# Patient Record
Sex: Male | Born: 2000 | Race: Black or African American | Hispanic: No | Marital: Single | Smoking: Never smoker
Health system: Southern US, Community
[De-identification: ages and names within clinical notes are randomized; demographics above are authoritative.]

## PROBLEM LIST (undated history)

## (undated) ENCOUNTER — Ambulatory Visit: Source: Home / Self Care | Attending: Family Medicine | Admitting: Family Medicine

## (undated) DIAGNOSIS — F909 Attention-deficit hyperactivity disorder, unspecified type: Secondary | ICD-10-CM

---
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
## 2022-04-06 IMAGING — US US BREAST*R* LIMITED INC AXILLA
1 series · 5 of 5 positions shown · non-contrast
Comparison: None.

CLINICAL DATA: Patient presents for palpable abnormality within the
right breast.

EXAM:
ULTRASOUND OF THE BILATERAL BREAST

[Series 1: us breast*right* limited inc axilla · 0.06mm/px · 5 of 5 slices shown]
[im 1/5]
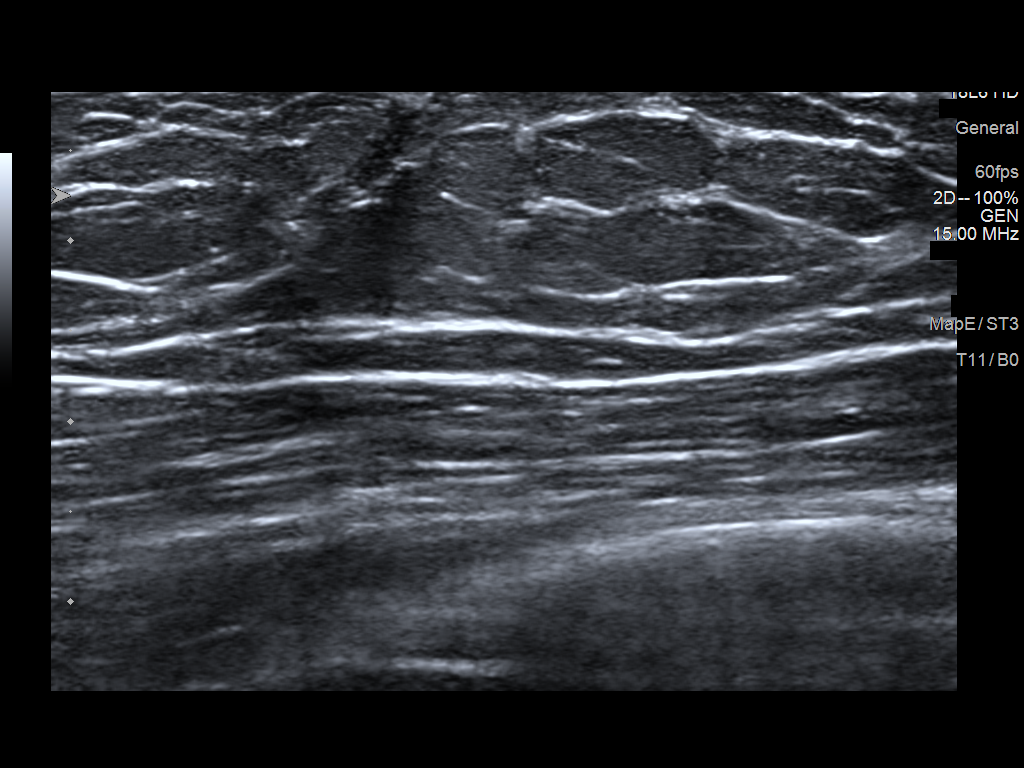
[im 2/5]
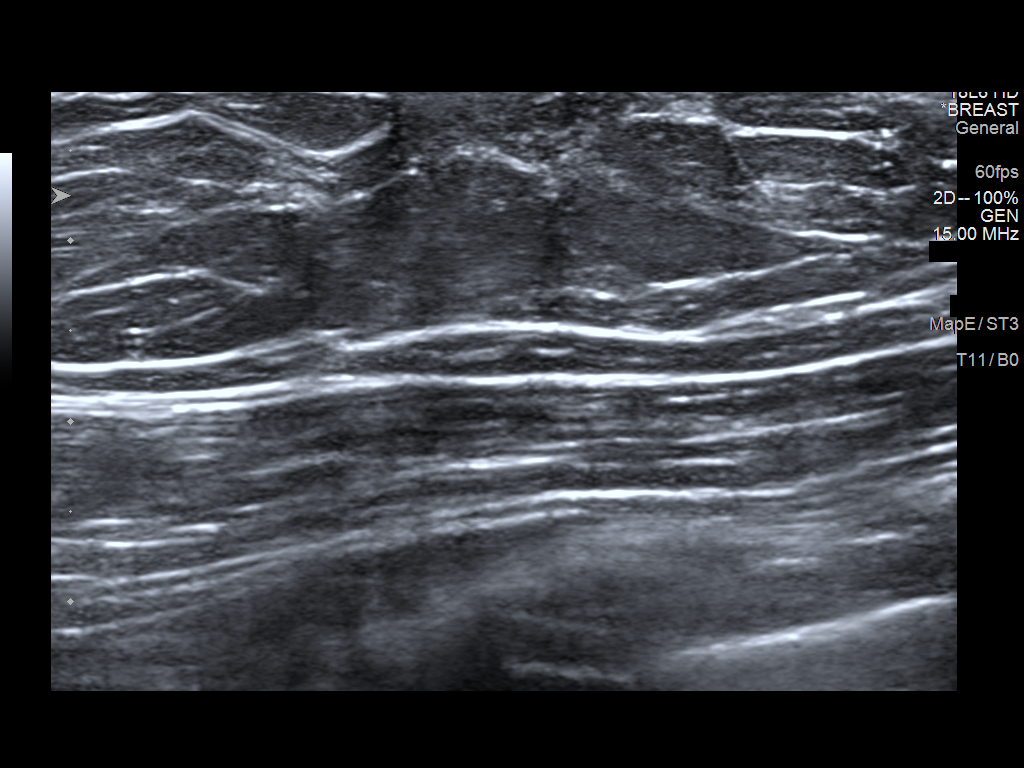
[im 3/5]
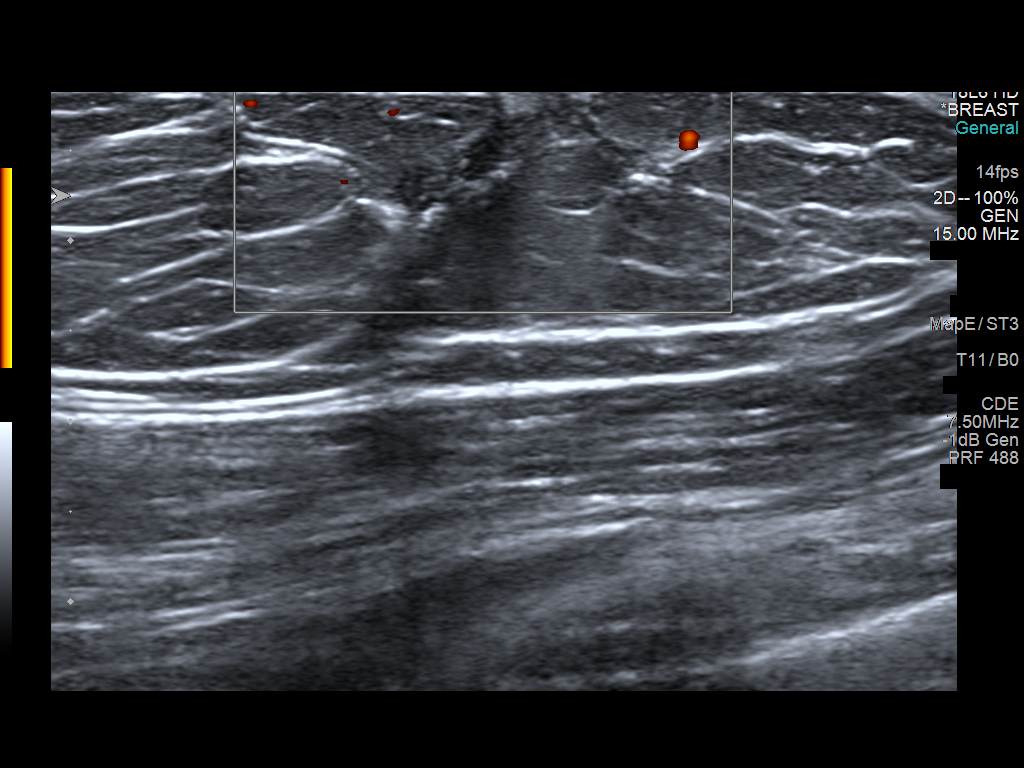
[im 4/5]
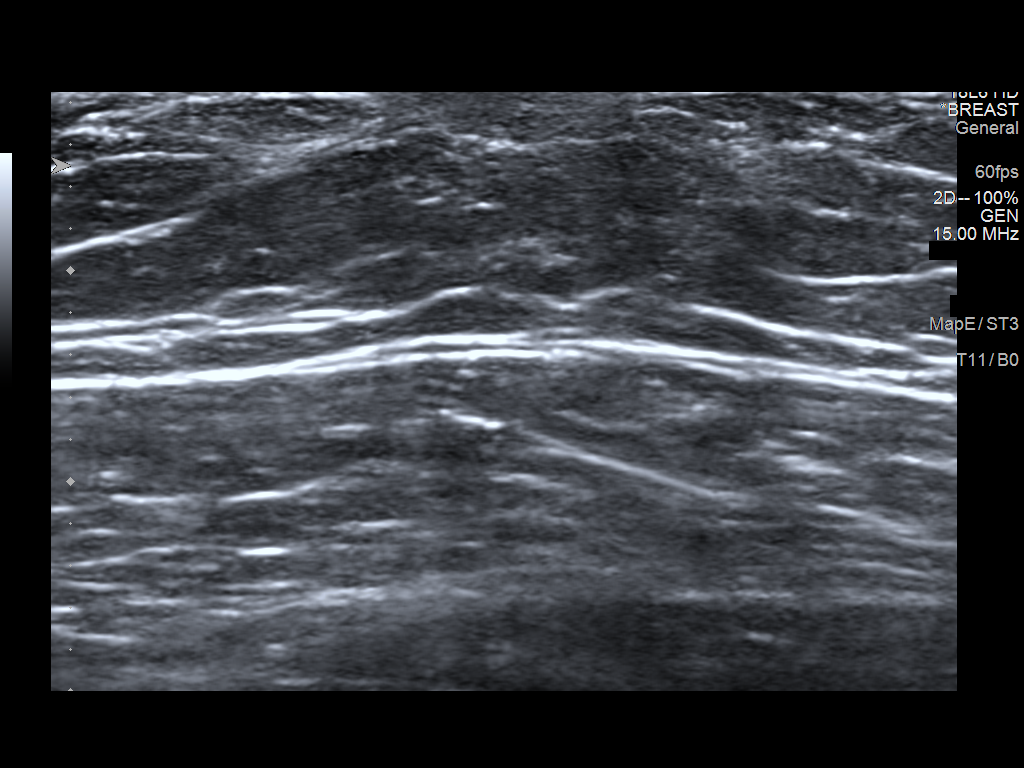
[im 5/5]
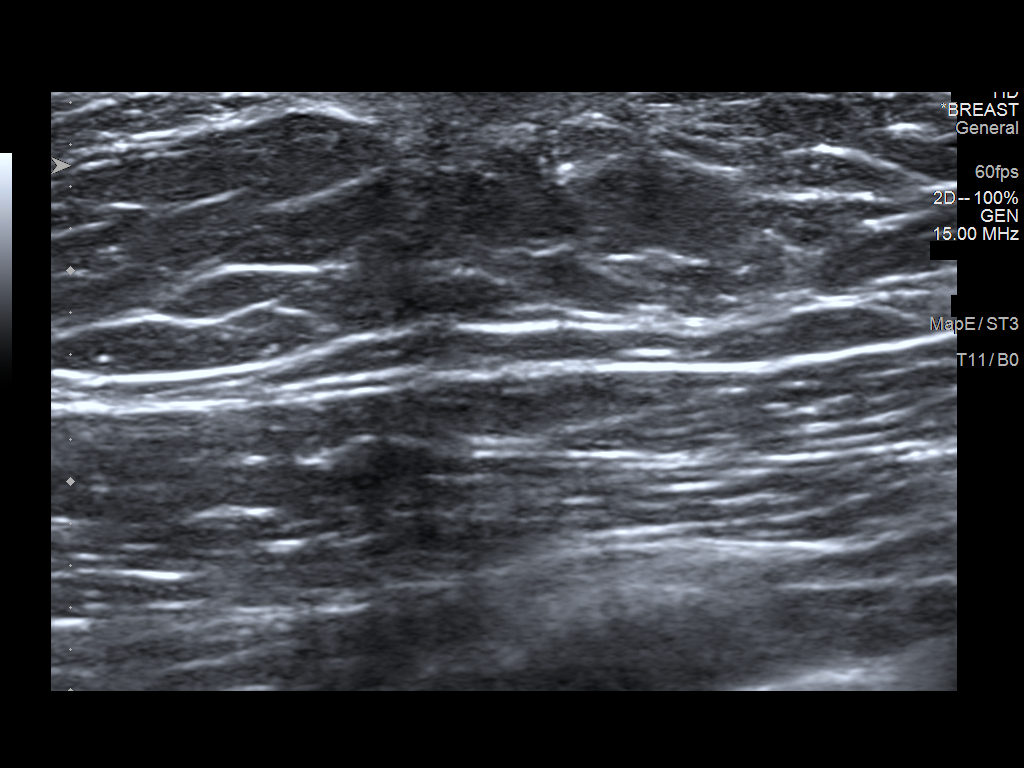

[5 of 5 positions shown; findings below may reference images not displayed]

FINDINGS: On physical exam, soft tissue is palpated within the retroareolar
right and retroareolar left breast.

Targeted ultrasound is performed, showing mild gynecomastia within
the right greater than left breast. No suspicious mass identified.
IMPRESSION: Bilateral mildly asymmetric retroareolar gynecomastia.

RECOMMENDATION:
Continued clinical evaluation for palpable areas of concern within
the right and left breast.

I have discussed the findings and recommendations with the patient.
If applicable, a reminder letter will be sent to the patient
regarding the next appointment.

BI-RADS CATEGORY  2: Benign.

## 2022-05-27 ENCOUNTER — Ambulatory Visit
Admission: EM | Admit: 2022-05-27 | Discharge: 2022-05-27 | Disposition: A | Discharge status: Home / Self Care | Payer: Medicaid Other | Attending: Physician Assistant | Admitting: Physician Assistant

## 2022-05-27 DIAGNOSIS — R112 Nausea with vomiting, unspecified: Secondary | ICD-10-CM

## 2022-05-27 DIAGNOSIS — K529 Noninfective gastroenteritis and colitis, unspecified: Secondary | ICD-10-CM | POA: Diagnosis not present

## 2022-05-27 MED ORDER — ONDANSETRON HCL 4 MG PO TABS: 4.0000 mg | ORAL_TABLET | Freq: Three times a day (TID) | ORAL | 0 refills | Status: DC | PRN

## 2022-05-27 MED ORDER — FAMOTIDINE 20 MG PO TABS
20.0000 mg | ORAL_TABLET | Freq: Once | ORAL | Status: AC
  Administered 2022-05-27: 20 mg via ORAL

## 2022-05-27 MED ORDER — ONDANSETRON 4 MG PO TBDP
4.0000 mg | ORAL_TABLET | Freq: Once | ORAL | Status: AC
  Administered 2022-05-27: 4 mg via ORAL

## 2022-05-27 MED ORDER — FAMOTIDINE 20 MG PO TABS: 20.0000 mg | ORAL_TABLET | Freq: Two times a day (BID) | ORAL | 0 refills | Status: DC

## 2022-05-27 NOTE — ED Triage Notes (Signed)
Pt presents to [facility] with co of ABD pain, nausea, vomiting for 3 days. Pain comes in waves and is stabbing in nature 9/10 . Pt reports he thought he was sick with food poisoning but 3 days he thought he would feel better by now. Pt reports pain is ruq and luq when it happens. Pt reports nausea/ pain is worse after eating. Has been able to keep down water.

## 2022-05-27 NOTE — Discharge Instructions (Signed)
Advised to take the Zofran 4 mg every 6 hours on a regular basis to help decrease nausea and vomiting. Advised to take Pepcid 20 mg twice daily in order to help control stomach irritation. Advised increase fluid intake and follow a bland diet for the next 48 hours. Advised to follow-up PCP or return to urgent care if symptoms fail to improve.

## 2022-05-27 NOTE — ED Provider Notes (Signed)
[name]    CSN: [id number] Arrival date & time: 05/27/22  1244      History   Chief Complaint Chief Complaint  Patient presents with   Abdominal Pain   Nausea   Vomiting   Diarrhea    HPI Erik [name] is a 21 y.o. male.   21 year old male presents with nausea and vomiting.  Patient indicates that on Monday he started having some abdominal cramping, nausea, with intermittent vomiting.  Patient also indicates that he has had some diarrhea, and loose bowels associated with his symptoms.  Patient indicates that the abdominal pain and vomiting started after he ate some old brownies at work.  Patient indicates for the past 4 days he has continued to have intermittent abdominal cramping pain with nausea and vomiting and intermittent diarrhea.  Patient indicates he is able to drink fluids well without any problems but as soon as he eats about 30 minutes later he has cramping and has to throw up.  He Isabella Stalling that the diarrhea has been watery and loose without any blood.  He has not been around any family, friends, or coworkers with similar symptoms.  He denies having any fever or chills.   Abdominal Pain Associated symptoms: diarrhea, nausea and vomiting   Diarrhea Associated symptoms: abdominal pain and vomiting     Past Medical History:  Diagnosis Date   ADHD (attention deficit hyperactivity disorder)     Patient Active Problem List   Diagnosis Date Noted   Acute appendicitis 05/22/2018    Past Surgical History:  Procedure Laterality Date   LAPAROSCOPIC APPENDECTOMY N/A 05/22/2018   Procedure: APPENDECTOMY LAPAROSCOPIC;  Surgeon: Leonia Corona, MD;  Location: [facility];  Service: Pediatrics;  Laterality: N/A;       Home Medications    Prior to Admission medications   Medication Sig Start Date End Date Taking? Authorizing Provider  famotidine (PEPCID) 20 MG tablet Take 1 tablet (20 mg total) by mouth 2 (two) times daily. 05/27/22  Yes Ellsworth Lennox, PA-C   ondansetron (ZOFRAN) 4 MG tablet Take 1 tablet (4 mg total) by mouth every 8 (eight) hours as needed for nausea or vomiting. 05/27/22  Yes Ellsworth Lennox, PA-C  ADDERALL XR 10 MG 24 hr capsule Take by mouth. Patient not taking: Reported on 05/27/2022 05/01/20   [provider]  HYDROcodone-acetaminophen (NORCO/VICODIN) 5-325 MG tablet Take 1-1.5 tablets by mouth every 6 (six) hours as needed for moderate pain. Take only if ibuprofen does not help the pain. Patient not taking: Reported on 06/02/2020 05/23/18   Leonia Corona, MD    Family History History reviewed. No pertinent family history.  Social History Social History   Tobacco Use   Smoking status: Never   Smokeless tobacco: Never     Allergies   Patient has no known allergies.   Review of Systems Review of Systems  Gastrointestinal:  Positive for abdominal pain, diarrhea, nausea and vomiting.     Physical Exam Triage Vital Signs ED Triage Vitals  Enc Vitals Group     BP 05/27/22 1307 119/74     Pulse Rate 05/27/22 1306 62     Resp 05/27/22 1306 20     Temp 05/27/22 1306 98 F (36.7 C)     Temp src --      SpO2 05/27/22 1306 97 %     Weight --      Height --      Head Circumference --      Peak Flow --  Pain Score 05/27/22 1305 0     Pain Loc --      Pain Edu? --      Excl. in GC? --    No data found.  Updated Vital Signs BP 119/74   Pulse 62   Temp 98 F (36.7 C)   Resp 20   SpO2 97%   Visual Acuity Right Eye Distance:   Left Eye Distance:   Bilateral Distance:    Right Eye Near:   Left Eye Near:    Bilateral Near:     Physical Exam Constitutional:      Appearance: He is well-developed.  HENT:     Mouth/Throat:     Mouth: Mucous membranes are moist.     Pharynx: Oropharynx is clear.  Cardiovascular:     Rate and Rhythm: Normal rate and regular rhythm.     Heart sounds: Normal heart sounds.  Pulmonary:     Effort: Pulmonary effort is normal.     Breath sounds: Normal  breath sounds and air entry. No wheezing, rhonchi or rales.  Abdominal:     General: Abdomen is flat. Bowel sounds are increased.     Palpations: Abdomen is soft.     Tenderness: There is no abdominal tenderness. There is no guarding or rebound.  Neurological:     Mental Status: He is alert.      UC Treatments / Results  Labs (all labs ordered are listed, but only abnormal results are displayed) Labs Reviewed - No data to display  EKG   Radiology No results found.  Procedures Procedures (including critical care time)  Medications Ordered in UC Medications  ondansetron (ZOFRAN-ODT) disintegrating tablet 4 mg (4 mg Oral Given 05/27/22 1319)  famotidine (PEPCID) tablet 20 mg (20 mg Oral Given 05/27/22 1319)    Initial Impression / Assessment and Plan / UC Course  I have reviewed the triage vital signs and the nursing notes.  Pertinent labs & imaging results that were available during my care of the patient were reviewed by me and considered in my medical decision making (see chart for details).    Plan: 1.  The nausea and vomiting will be treated with the following: A.  Zofran 4 mg given in the office today. B.  Zofran 4 mg every 6 hours on a regular basis to help control abdominal cramping and nausea. 2.  The gastroenteritis will be treated with the following: A.  Zofran 4 mg every 6 hours on a regular basis to help control nausea and vomiting. B.  Pepcid 20 mg every 12 hours to help control excess acid production in the stomach. 3.  Patient advised to increase fluid intake mainly clear liquids and bland diet for the next 48 hours. 4.  Patient advised follow-up with PCP or return to urgent care if symptoms fail to improve. Final Clinical Impressions(s) / UC Diagnoses   Final diagnoses:  Nausea and vomiting, unspecified vomiting type  Noninfectious gastroenteritis, unspecified type     Discharge Instructions      Advised to take the Zofran 4 mg every 6 hours on a  regular basis to help decrease nausea and vomiting. Advised to take Pepcid 20 mg twice daily in order to help control stomach irritation. Advised increase fluid intake and follow a bland diet for the next 48 hours. Advised to follow-up PCP or return to urgent care if symptoms fail to improve.    ED Prescriptions     Medication Sig Dispense Auth. Provider  ondansetron (ZOFRAN) 4 MG tablet Take 1 tablet (4 mg total) by mouth every 8 (eight) hours as needed for nausea or vomiting. 20 tablet Ellsworth Lennox, PA-C   famotidine (PEPCID) 20 MG tablet Take 1 tablet (20 mg total) by mouth 2 (two) times daily. 30 tablet Ellsworth Lennox, PA-C      PDMP not reviewed this encounter.   Ellsworth Lennox, PA-C 05/27/22 1324

## 2022-07-20 ENCOUNTER — Ambulatory Visit (INDEPENDENT_AMBULATORY_CARE_PROVIDER_SITE_OTHER): Payer: Medicaid Other | Admitting: Primary Care

## 2022-07-20 ENCOUNTER — Encounter (INDEPENDENT_AMBULATORY_CARE_PROVIDER_SITE_OTHER): Payer: Self-pay | Admitting: Primary Care

## 2022-07-20 VITALS — BP 128/83 | HR 68 | Resp 16 | Ht 67.0 in | Wt 175.2 lb

## 2022-07-20 DIAGNOSIS — R0989 Other specified symptoms and signs involving the circulatory and respiratory systems: Secondary | ICD-10-CM | POA: Diagnosis not present

## 2022-07-20 DIAGNOSIS — Z23 Encounter for immunization: Secondary | ICD-10-CM | POA: Diagnosis not present

## 2022-07-20 DIAGNOSIS — Z833 Family history of diabetes mellitus: Secondary | ICD-10-CM

## 2022-07-20 DIAGNOSIS — Z114 Encounter for screening for human immunodeficiency virus [HIV]: Secondary | ICD-10-CM | POA: Diagnosis not present

## 2022-07-20 DIAGNOSIS — F4323 Adjustment disorder with mixed anxiety and depressed mood: Secondary | ICD-10-CM

## 2022-07-20 DIAGNOSIS — Z1159 Encounter for screening for other viral diseases: Secondary | ICD-10-CM | POA: Diagnosis not present

## 2022-07-20 DIAGNOSIS — Z0000 Encounter for general adult medical examination without abnormal findings: Secondary | ICD-10-CM

## 2022-07-20 DIAGNOSIS — Z9049 Acquired absence of other specified parts of digestive tract: Secondary | ICD-10-CM

## 2022-07-20 DIAGNOSIS — E663 Overweight: Secondary | ICD-10-CM | POA: Diagnosis not present

## 2022-07-20 NOTE — Progress Notes (Signed)
New Patient Office Visit  Subjective    Patient ID: Erik [name], male    DOB: [birth year]  Age: 22 y.o. MRN: [id number]  CC:  Chief Complaint  Patient presents with   New Patient (Initial Visit)    HPI Erik [name] is a 22 year male not sexually active homosexual male presents to establish care.He is aware of transmission of STI and the use of condoms for protection.  He voices no complaints or concerns due to his age no longer pediatric patient. Saturday he had a fever - 100.3 productive cough with sore throat and shortness of breath. Self resolved. Patient has No headache, No chest pain, No abdominal pain - No Nausea, No new weakness tingling or numbness, No Cough - shortness of breath . He has depression and anxiety no medication . He denies harm to self or others, and not suicidal ideation.    Outpatient Encounter Medications as of 07/20/2022  Medication Sig   ADDERALL XR 10 MG 24 hr capsule Take by mouth. (Patient not taking: Reported on 05/27/2022)   famotidine (PEPCID) 20 MG tablet Take 1 tablet (20 mg total) by mouth 2 (two) times daily. (Patient not taking: Reported on 07/20/2022)   HYDROcodone-acetaminophen (NORCO/VICODIN) 5-325 MG tablet Take 1-1.5 tablets by mouth every 6 (six) hours as needed for moderate pain. Take only if ibuprofen does not help the pain. (Patient not taking: Reported on 06/02/2020)   ondansetron (ZOFRAN) 4 MG tablet Take 1 tablet (4 mg total) by mouth every 8 (eight) hours as needed for nausea or vomiting. (Patient not taking: Reported on 07/20/2022)   No facility-administered encounter medications on file as of 07/20/2022.    Past Medical History:  Diagnosis Date   ADHD (attention deficit hyperactivity disorder)     Past Surgical History:  Procedure Laterality Date   LAPAROSCOPIC APPENDECTOMY N/A 05/22/2018   Procedure: APPENDECTOMY LAPAROSCOPIC;  Surgeon: Gerald Stabs, MD;  Location: [place];  Service: Pediatrics;  Laterality: N/A;     No family history on file.  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 Lives with grandparents and sister. Works at [employer].   Social Determinants of Health   Financial Resource Strain: Low Risk  (05/22/2018)   Overall Financial Resource Strain (CARDIA)    Difficulty of Paying Living Expenses: Not hard at all  Food Insecurity: No Food Insecurity (05/22/2018)   Hunger Vital Sign    Worried About Running Out of Food in the Last Year: Never true    [place] in the Last Year: Never true  Transportation Needs: Unknown (05/22/2018)   PRAPARE - Transportation    Lack of Transportation (Medical): Patient refused    Lack of Transportation (Non-Medical): Patient refused  Physical Activity: Not on file  Stress: No Stress Concern Present (05/22/2018)   [employer] [facility] [place]    Feeling  of Stress : Not at all  Social Connections: Unknown (05/22/2018)   Social Connection and Isolation Panel [NHANES]    Frequency of Communication with Friends and Family: Not on file    Frequency of Social Gatherings with Friends and Family: Not on file    Attends Religious Services: Not on file    Active Member of Clubs or Organizations: Yes    Attends Archivist Meetings: Not on file    Marital Status: Not on file  Intimate Partner Violence: Unknown (05/22/2018)   Humiliation, Afraid, Rape, and Kick questionnaire    Fear of  Current or Ex-Partner: Patient refused    Emotionally Abused: Patient refused    Physically Abused: Patient refused    Sexually Abused: Patient refused    ROS Comprehensive ROS Pertinent positive and negative noted in HPI       Objective    Blood Pressure 128/83   Pulse 68   Respiration 16   Height 5\' 7"  (1.702 m)   Weight 175 lb 3.2 oz (79.5 kg)   Oxygen Saturation 96%   Body Mass Index 27.44 kg/m   Physical Exam General: No apparent distress. Eyes: Extraocular eye movements intact, pupils equal and round. Neck: Supple, trachea midline. Thyroid: No enlargement, mobile without fixation, no tenderness. Cardiovascular: Regular rhythm and rate, no murmur, normal radial pulses. Respiratory: Normal respiratory effort, clear to auscultation. Gastrointestinal: Normal pitch active bowel sounds, nontender abdomen without distention or appreciable hepatomegaly. Neurologic: Cranial nerves normal as tested, deep tendon reflexes  Musculoskeletal: Normal muscle tone, no tenderness on palpation of tibia, no excessive thoracic kyphosis. Skin: Appropriate warmth, no visible rash. Mental status: Alert, conversant, speech clear, thought logical, appropriate mood and affect, no hallucinations or delusions evident. Hematologic/lymphatic: No cervical adenopathy, no visible ecchymoses.     Assessment & Plan:  Erik [name] was seen today for new patient (initial visit).  Diagnoses and all orders for this visit:  Encounter for medical examination to establish care -     CBC with Differential -     CMP14+EGFR  Adjustment reaction with anxiety and depression Refer to CSW PHQ 19   Overweight (BMI 25.0-29.9) Discussed diet and exercise for person with BMI >25. Instructed: You must burn more calories than you eat. Losing 5 percent of your body weight should be considered a success. In the longer term, losing more than 15 percent of your body weight and staying at this weight is an extremely good  result. However, keep in mind that even losing 5 percent of your body weight leads to important health benefits, so try not to get discouraged if you're not able to lose more than this. Will recheck weight in 3-6 months.   Respiratory symptoms See HPI -     COVID-19, Flu A+B and RSV -     CBC with Differential -     CMP14+EGFR  Encounter for HCV screening test for low risk patient -     HCV Ab w Reflex to Quant PCR  Screening for HIV (human immunodeficiency virus) -     HIV Antibody (routine testing w rflx)  History of appendectomy  Family history of diabetes mellitus in mother -     Hemoglobin A1c  Need for diphtheria-tetanus-pertussis (Tdap) vaccine -     Tdap vaccine greater than or equal to 7yo IM- future     Return in about 1 year (around 07/21/2023).   Kerin Perna, NP

## 2022-07-20 NOTE — Patient Instructions (Signed)
Calorie Counting for Weight Loss Calories are units of energy. Your body needs a certain number of calories from food to keep going throughout the day. When you eat or drink more calories than your body needs, your body stores the extra calories mostly as fat. When you eat or drink fewer calories than your body needs, your body burns fat to get the energy it needs. Calorie counting means keeping track of how many calories you eat and drink each day. Calorie counting can be helpful if you need to lose weight. If you eat fewer calories than your body needs, you should lose weight. Ask your health care provider what a healthy weight is for you. For calorie counting to work, you will need to eat the right number of calories each day to lose a healthy amount of weight per week. A dietitian can help you figure out how many calories you need in a day and will suggest ways to reach your calorie goal. A healthy amount of weight to lose each week is usually 1-2 lb (0.5-0.9 kg). This usually means that your daily calorie intake should be reduced by 500-750 calories. Eating 1,200-1,500 calories a day can help most women lose weight. Eating 1,500-1,800 calories a day can help most men lose weight. What do I need to know about calorie counting? Work with your health care provider or dietitian to determine how many calories you should get each day. To meet your daily calorie goal, you will need to: Find out how many calories are in each food that you would like to eat. Try to do this before you eat. Decide how much of the food you plan to eat. Keep a food log. Do this by writing down what you ate and how many calories it had. To successfully lose weight, it is important to balance calorie counting with a healthy lifestyle that includes regular activity. Where do I find calorie information?  The number of calories in a food can be found on a Nutrition Facts label. If a food does not have a Nutrition Facts label, try  to look up the calories online or ask your dietitian for help. Remember that calories are listed per serving. If you choose to have more than one serving of a food, you will have to multiply the calories per serving by the number of servings you plan to eat. For example, the label on a package of bread might say that a serving size is 1 slice and that there are 90 calories in a serving. If you eat 1 slice, you will have eaten 90 calories. If you eat 2 slices, you will have eaten 180 calories. How do I keep a food log? After each time that you eat, record the following in your food log as soon as possible: What you ate. Be sure to include toppings, sauces, and other extras on the food. How much you ate. This can be measured in cups, ounces, or number of items. How many calories were in each food and drink. The total number of calories in the food you ate. Keep your food log near you, such as in a pocket-sized notebook or on an app or website on your mobile phone. Some programs will calculate calories for you and show you how many calories you have left to meet your daily goal. What are some portion-control tips? Know how many calories are in a serving. This will help you know how many servings you can have of a certain   food. Use a measuring cup to measure serving sizes. You could also try weighing out portions on a kitchen scale. With time, you will be able to estimate serving sizes for some foods. Take time to put servings of different foods on your favorite plates or in your favorite bowls and cups so you know what a serving looks like. Try not to eat straight from a food's packaging, such as from a bag or box. Eating straight from the package makes it hard to see how much you are eating and can lead to overeating. Put the amount you would like to eat in a cup or on a plate to make sure you are eating the right portion. Use smaller plates, glasses, and bowls for smaller portions and to prevent  overeating. Try not to multitask. For example, avoid watching TV or using your computer while eating. If it is time to eat, sit down at a table and enjoy your food. This will help you recognize when you are full. It will also help you be more mindful of what and how much you are eating. What are tips for following this plan? Reading food labels Check the calorie count compared with the serving size. The serving size may be smaller than what you are used to eating. Check the source of the calories. Try to choose foods that are high in protein, fiber, and vitamins, and low in saturated fat, trans fat, and sodium. Shopping Read nutrition labels while you shop. This will help you make healthy decisions about which foods to buy. Pay attention to nutrition labels for low-fat or fat-free foods. These foods sometimes have the same number of calories or more calories than the full-fat versions. They also often have added sugar, starch, or salt to make up for flavor that was removed with the fat. Make a grocery list of lower-calorie foods and stick to it. Cooking Try to cook your favorite foods in a healthier way. For example, try baking instead of frying. Use low-fat dairy products. Meal planning Use more fruits and vegetables. One-half of your plate should be fruits and vegetables. Include lean proteins, such as chicken, turkey, and fish. Lifestyle Each week, aim to do one of the following: 150 minutes of moderate exercise, such as walking. 75 minutes of vigorous exercise, such as running. General information Know how many calories are in the foods you eat most often. This will help you calculate calorie counts faster. Find a way of tracking calories that works for you. Get creative. Try different apps or programs if writing down calories does not work for you. What foods should I eat?  Eat nutritious foods. It is better to have a nutritious, high-calorie food, such as an avocado, than a food with  few nutrients, such as a bag of potato chips. Use your calories on foods and drinks that will fill you up and will not leave you hungry soon after eating. Examples of foods that fill you up are nuts and nut butters, vegetables, lean proteins, and high-fiber foods such as whole grains. High-fiber foods are foods with more than 5 g of fiber per serving. Pay attention to calories in drinks. Low-calorie drinks include water and unsweetened drinks. The items listed above may not be a complete list of foods and beverages you can eat. Contact a dietitian for more information. What foods should I limit? Limit foods or drinks that are not good sources of vitamins, minerals, or protein or that are high in unhealthy fats. These   include: Candy. Other sweets. Sodas, specialty coffee drinks, alcohol, and juice. The items listed above may not be a complete list of foods and beverages you should avoid. Contact a dietitian for more information. How do I count calories when eating out? Pay attention to portions. Often, portions are much larger when eating out. Try these tips to keep portions smaller: Consider sharing a meal instead of getting your own. If you get your own meal, eat only half of it. Before you start eating, ask for a container and put half of your meal into it. When available, consider ordering smaller portions from the menu instead of full portions. Pay attention to your food and drink choices. Knowing the way food is cooked and what is included with the meal can help you eat fewer calories. If calories are listed on the menu, choose the lower-calorie options. Choose dishes that include vegetables, fruits, whole grains, low-fat dairy products, and lean proteins. Choose items that are boiled, broiled, grilled, or steamed. Avoid items that are buttered, battered, fried, or served with cream sauce. Items labeled as crispy are usually fried, unless stated otherwise. Choose water, low-fat milk,  unsweetened iced tea, or other drinks without added sugar. If you want an alcoholic beverage, choose a lower-calorie option, such as a glass of wine or light beer. Ask for dressings, sauces, and syrups on the side. These are usually high in calories, so you should limit the amount you eat. If you want a salad, choose a garden salad and ask for grilled meats. Avoid extra toppings such as bacon, cheese, or fried items. Ask for the dressing on the side, or ask for olive oil and vinegar or lemon to use as dressing. Estimate how many servings of a food you are given. Knowing serving sizes will help you be aware of how much food you are eating at restaurants. Where to find more information Centers for Disease Control and Prevention: www.cdc.gov U.S. Department of Agriculture: myplate.gov Summary Calorie counting means keeping track of how many calories you eat and drink each day. If you eat fewer calories than your body needs, you should lose weight. A healthy amount of weight to lose per week is usually 1-2 lb (0.5-0.9 kg). This usually means reducing your daily calorie intake by 500-750 calories. The number of calories in a food can be found on a Nutrition Facts label. If a food does not have a Nutrition Facts label, try to look up the calories online or ask your dietitian for help. Use smaller plates, glasses, and bowls for smaller portions and to prevent overeating. Use your calories on foods and drinks that will fill you up and not leave you hungry shortly after a meal. This information is not intended to replace advice given to you by your health care provider. Make sure you discuss any questions you have with your health care provider. Document Revised: 08/08/2019 Document Reviewed: 08/08/2019 Elsevier Patient Education  2023 Elsevier Inc.  

## 2022-07-21 LAB — CMP14+EGFR
ALT: 18 IU/L (ref 0–44)
AST: 14 IU/L (ref 0–40)
Albumin/Globulin Ratio: 2 (ref 1.2–2.2)
Albumin: 4.9 g/dL (ref 4.3–5.2)
Alkaline Phosphatase: 94 IU/L (ref 51–125)
BUN/Creatinine Ratio: 13 (ref 9–20)
BUN: 14 mg/dL (ref 6–20)
Bilirubin Total: 0.5 mg/dL (ref 0.0–1.2)
CO2: 23 mmol/L (ref 20–29)
Calcium: 10.1 mg/dL (ref 8.7–10.2)
Chloride: 101 mmol/L (ref 96–106)
Creatinine, Ser: 1.04 mg/dL (ref 0.76–1.27)
Globulin, Total: 2.5 g/dL (ref 1.5–4.5)
Glucose: 67 mg/dL — ABNORMAL LOW (ref 70–99)
Potassium: 4.4 mmol/L (ref 3.5–5.2)
Sodium: 139 mmol/L (ref 134–144)
Total Protein: 7.4 g/dL (ref 6.0–8.5)
eGFR: 106 mL/min/{1.73_m2} (ref >59)

## 2022-07-21 LAB — CBC WITH DIFFERENTIAL/PLATELET
Basophils Absolute: 0 10*3/uL (ref 0.0–0.2)
Basos: 0 %
EOS (ABSOLUTE): 0.1 10*3/uL (ref 0.0–0.4)
Eos: 2 %
Hematocrit: 47.6 % (ref 37.5–51.0)
Hemoglobin: 16 g/dL (ref 13.0–17.7)
Immature Grans (Abs): 0 10*3/uL (ref 0.0–0.1)
Immature Granulocytes: 0 %
Lymphocytes Absolute: 2.4 10*3/uL (ref 0.7–3.1)
Lymphs: 30 %
MCH: 31 pg (ref 26.6–33.0)
MCHC: 33.6 g/dL (ref 31.5–35.7)
MCV: 92 fL (ref 79–97)
Monocytes Absolute: 0.6 10*3/uL (ref 0.1–0.9)
Monocytes: 8 %
Neutrophils Absolute: 4.8 10*3/uL (ref 1.4–7.0)
Neutrophils: 60 %
Platelets: 220 10*3/uL (ref 150–450)
RBC: 5.16 x10E6/uL (ref 4.14–5.80)
RDW: 12.6 % (ref 11.6–15.4)
WBC: 8.1 10*3/uL (ref 3.4–10.8)

## 2022-07-21 LAB — HCV INTERPRETATION

## 2022-07-21 LAB — HCV AB W REFLEX TO QUANT PCR: HCV Ab: NONREACTIVE

## 2022-07-21 LAB — HIV ANTIBODY (ROUTINE TESTING W REFLEX): HIV Screen 4th Generation wRfx: NONREACTIVE

## 2022-07-21 LAB — HEMOGLOBIN A1C
Est. average glucose Bld gHb Est-mCnc: 105 mg/dL
Hgb A1c MFr Bld: 5.3 % (ref 4.8–5.6)

## 2022-07-22 LAB — COVID-19, FLU A+B AND RSV
Influenza A, NAA: NOT DETECTED
Influenza B, NAA: NOT DETECTED
RSV, NAA: NOT DETECTED
SARS-CoV-2, NAA: NOT DETECTED

## 2022-07-26 ENCOUNTER — Telehealth (INDEPENDENT_AMBULATORY_CARE_PROVIDER_SITE_OTHER): Payer: Self-pay

## 2022-07-26 NOTE — Telephone Encounter (Signed)
Contacted pt to go over lab results pt wasn't home spoke with pt grandmother and she will have pt give [place] a call back to go over results

## 2022-07-27 ENCOUNTER — Telehealth: Payer: Self-pay

## 2022-07-27 NOTE — Telephone Encounter (Signed)
Patient desire in house visit with [facility]. He is scheduled 08/02/22 @10am  at [facility]

## 2022-07-27 NOTE — Telephone Encounter (Signed)
-----  Message from Shann Medal, [place] sent at 07/26/2022  4:51 PM EST ----- Regarding: follow up Please follow up and evaluate clients needs from notes and client. Provide resources as needed.  ----- Message ----- From: Kerin Perna, NP Sent: 07/20/2022   2:18 PM EST To: Shann Medal, LCSW  Patient would benefit from LCSW intervention

## 2022-07-29 ENCOUNTER — Telehealth (INDEPENDENT_AMBULATORY_CARE_PROVIDER_SITE_OTHER): Payer: Self-pay | Admitting: Primary Care

## 2022-07-29 ENCOUNTER — Encounter (INDEPENDENT_AMBULATORY_CARE_PROVIDER_SITE_OTHER): Payer: Self-pay

## 2022-07-29 NOTE — Telephone Encounter (Signed)
Pt. Given results, verbalizes understanding. 

## 2022-08-02 ENCOUNTER — Ambulatory Visit: Payer: Medicaid Other | Admitting: Licensed Clinical Social Worker

## 2022-08-04 ENCOUNTER — Ambulatory Visit (INDEPENDENT_AMBULATORY_CARE_PROVIDER_SITE_OTHER): Payer: Medicaid Other | Admitting: Licensed Clinical Social Worker

## 2022-08-04 DIAGNOSIS — F4323 Adjustment disorder with mixed anxiety and depressed mood: Secondary | ICD-10-CM

## 2022-08-23 ENCOUNTER — Ambulatory Visit (INDEPENDENT_AMBULATORY_CARE_PROVIDER_SITE_OTHER): Payer: Medicaid Other | Admitting: Primary Care

## 2022-09-27 NOTE — BH Specialist Note (Signed)
Integrated Behavioral Health Initial In-Person Visit  MRN: [id number] Name: Erik [name]  Number of [facility] Clinician visits: 1- Initial Visit  Session Start time: [id number]    Session End time: [id number]  Total time in minutes: 33   Types of Service: Individual psychotherapy  Interpretor:No. Interpretor Name and Language: n/a   Warm Hand Off Completed.    Subjective: Erik [name] is a 22 y.o. male accompanied by  himself Patient was referred by PCP for anxiety and depression. Patient reports the following symptoms/concerns: feeling nervous and anxious, worrying, easily ignored and feeling something bad will happen. Duration of problem: since his mother passed at a very young age; Severity of problem: mild  Objective: Mood: Anxious and Affect: Appropriate Risk of harm to self or others: No plan to harm self or others  Life Context: Family and Social: He and his sister are living with their grandparents School/Work: In college  Self-Care: cooking Life Changes: mother died when he was very young. Sister has had suicidal ideations  Patient and/or Family's Strengths/Protective Factors: Concrete supports in place (healthy food, safe environments, etc.) and Sense of purpose  Goals Addressed: Patient will: Reduce symptoms of: anxiety and depression Increase knowledge and/or ability of: coping skills  Demonstrate ability to: Increase healthy adjustment to current life circumstances  Progress towards Goals: Ongoing  Interventions: Interventions utilized: Mindfulness or Relaxation Training and Supportive Counseling  Standardized Assessments completed: GAD-7 and PHQ 9 Elevate mood and show evidence of usual energy, activities, and socialization level Reduce irritability and increase normal social interaction with family and friends   Patient and/or Family Response: Patient was very receptive to receiving services.  Patient discussed his family history with  the death of his mother at a young age.  Patient was able to process his feelings during his early childhood male.  And the importance of his relationship with his sister.  Patient discussed his feelings of anxiety and depression.  Therapist and patient were able to develop coping skills.  Patient Centered Plan: Patient is on the following Treatment Plan(s):  Anxiety /Depression  Assessment: Patient currently experiencing feeling nervous and anxious, worrying, easily ignored and feeling something bad will happen.   Patient may benefit from support from [facility] and outside therapist.  Plan: Follow up with behavioral health clinician on : 4 weeks Behavioral recommendations: on going therapy Referral(s): [facility]) and Counselor "From scale of 1-10, how likely are you to follow plan?": not sure  Shann Medal, LCSWA

## 2022-11-30 ENCOUNTER — Encounter: Payer: Self-pay | Admitting: Emergency Medicine

## 2022-11-30 ENCOUNTER — Ambulatory Visit
Admission: EM | Admit: 2022-11-30 | Discharge: 2022-11-30 | Disposition: A | Discharge status: Home / Self Care | Payer: Medicaid Other | Attending: Internal Medicine | Admitting: Internal Medicine

## 2022-11-30 DIAGNOSIS — Z1152 Encounter for screening for COVID-19: Secondary | ICD-10-CM | POA: Insufficient documentation

## 2022-11-30 DIAGNOSIS — B9789 Other viral agents as the cause of diseases classified elsewhere: Secondary | ICD-10-CM | POA: Insufficient documentation

## 2022-11-30 DIAGNOSIS — J069 Acute upper respiratory infection, unspecified: Secondary | ICD-10-CM | POA: Insufficient documentation

## 2022-11-30 DIAGNOSIS — J029 Acute pharyngitis, unspecified: Secondary | ICD-10-CM | POA: Diagnosis not present

## 2022-11-30 DIAGNOSIS — R059 Cough, unspecified: Secondary | ICD-10-CM | POA: Diagnosis present

## 2022-11-30 LAB — POCT RAPID STREP A (OFFICE): Rapid Strep A Screen: NEGATIVE

## 2022-11-30 MED ORDER — FLUTICASONE PROPIONATE 50 MCG/ACT NA SUSP: 1.0000 | Freq: Every day | NASAL | 0 refills | Status: DC

## 2022-11-30 MED ORDER — CHLORASEPTIC 1.4 % MT LIQD: 1.0000 | OROMUCOSAL | 0 refills | Status: DC | PRN

## 2022-11-30 MED ORDER — BENZONATATE 100 MG PO CAPS: 100.0000 mg | ORAL_CAPSULE | Freq: Three times a day (TID) | ORAL | 0 refills | Status: DC | PRN

## 2022-11-30 NOTE — Discharge Instructions (Signed)
Strep is negative.  Throat culture and COVID test pending.  Will call if it is abnormal.  I have sent you 3 medications to alleviate symptoms.  Follow-up if any symptoms persist or worsen.

## 2022-11-30 NOTE — ED Triage Notes (Signed)
Headache, sore throat, fever, starting Sunday. Tmax 102 at home. Reports nasal congestion, wheezing, abdominal pain, N/V/D. Taking dayquil and nyquil at home with mild relief.

## 2022-11-30 NOTE — ED Provider Notes (Signed)
[name]    CSN: [id number] Arrival date & time: 11/30/22  1643      History   Chief Complaint No chief complaint on file.   HPI Erik [name] is a 22 y.o. male.   Patient presents with headache, sore throat, fever, nasal congestion, cough that started about 4 days ago.  Patient denies chest pain, shortness of breath, nausea, vomiting, diarrhea.  Patient is taking DayQuil and NyQuil with minimal improvement of symptoms.  Tmax at home was 102 when symptoms first started.  Denies any known sick contacts.  Denies history of asthma.     Past Medical History:  Diagnosis Date   ADHD (attention deficit hyperactivity disorder)     Patient Active Problem List   Diagnosis Date Noted   Acute appendicitis 05/22/2018    Past Surgical History:  Procedure Laterality Date   LAPAROSCOPIC APPENDECTOMY N/A 05/22/2018   Procedure: APPENDECTOMY LAPAROSCOPIC;  Surgeon: Leonia Corona, MD;  Location: [facility] OR;  Service: Pediatrics;  Laterality: N/A;       Home Medications    Prior to Admission medications   Medication Sig Start Date End Date Taking? Authorizing Provider  benzonatate (TESSALON) 100 MG capsule Take 1 capsule (100 mg total) by mouth every 8 (eight) hours as needed for cough. 11/30/22  Yes Bettie Capistran, Rolly Salter E, FNP  fluticasone (FLONASE) 50 MCG/ACT nasal spray Place 1 spray into both nostrils daily. 11/30/22  Yes Ednah Hammock, Rolly Salter E, FNP  phenol (CHLORASEPTIC) 1.4 % LIQD Use as directed 1 spray in the mouth or throat as needed for throat irritation / pain. 11/30/22  Yes Lashun Ramseyer, [place] E, FNP  ADDERALL XR 10 MG 24 hr capsule Take by mouth. Patient not taking: Reported on 05/27/2022 05/01/20   [provider]  famotidine (PEPCID) 20 MG tablet Take 1 tablet (20 mg total) by mouth 2 (two) times daily. Patient not taking: Reported on 07/20/2022 05/27/22   Ellsworth Lennox, PA-C  HYDROcodone-acetaminophen (NORCO/VICODIN) 5-325 MG tablet Take 1-1.5 tablets by mouth every 6  (six) hours as needed for moderate pain. Take only if ibuprofen does not help the pain. Patient not taking: Reported on 06/02/2020 05/23/18   Leonia Corona, MD  ondansetron (ZOFRAN) 4 MG tablet Take 1 tablet (4 mg total) by mouth every 8 (eight) hours as needed for nausea or vomiting. Patient not taking: Reported on 07/20/2022 05/27/22   Ellsworth Lennox, PA-C    Family History History reviewed. No pertinent family history.  Social History Social History   Tobacco Use   Smoking status: Never   Smokeless tobacco: Never     Allergies   Patient has no known allergies.   Review of Systems Review of Systems Per HPI  Physical Exam Triage Vital Signs ED Triage Vitals  Enc Vitals Group     BP 11/30/22 1908 127/87     Pulse Rate 11/30/22 1908 77     Resp 11/30/22 1908 16     Temp 11/30/22 1908 98.7 F (37.1 C)     Temp Source 11/30/22 1908 Oral     SpO2 11/30/22 1908 98 %     Weight --      Height --      Head Circumference --      Peak Flow --      Pain Score 11/30/22 1909 6     Pain Loc --      Pain Edu? --      Excl. in GC? --    No data found.  Updated  Vital Signs BP 127/87 (BP Location: Left Arm)   Pulse 77   Temp 98.7 F (37.1 C) (Oral)   Resp 16   SpO2 98%   Visual Acuity Right Eye Distance:   Left Eye Distance:   Bilateral Distance:    Right Eye Near:   Left Eye Near:    Bilateral Near:     Physical Exam Constitutional:      General: He is not in acute distress.    Appearance: Normal appearance. He is not toxic-appearing or diaphoretic.  HENT:     Head: Normocephalic and atraumatic.     Right Ear: Tympanic membrane and ear canal normal.     Left Ear: Tympanic membrane and ear canal normal.     Nose: Congestion present.     Mouth/Throat:     Mouth: Mucous membranes are moist.     Pharynx: Posterior oropharyngeal erythema present.  Eyes:     Extraocular Movements: Extraocular movements intact.     Conjunctiva/sclera: Conjunctivae normal.      Pupils: Pupils are equal, round, and reactive to light.  Cardiovascular:     Rate and Rhythm: Normal rate and regular rhythm.     Pulses: Normal pulses.     Heart sounds: Normal heart sounds.  Pulmonary:     Effort: Pulmonary effort is normal. No respiratory distress.     Breath sounds: Normal breath sounds. No wheezing.  Abdominal:     General: Abdomen is flat. Bowel sounds are normal.     Palpations: Abdomen is soft.  Musculoskeletal:        General: Normal range of motion.     Cervical back: Normal range of motion.  Skin:    General: Skin is warm and dry.  Neurological:     General: No focal deficit present.     Mental Status: He is alert and oriented to person, place, and time. Mental status is at baseline.  Psychiatric:        Mood and Affect: Mood normal.        Behavior: Behavior normal.      UC Treatments / Results  Labs (all labs ordered are listed, but only abnormal results are displayed) Labs Reviewed  CULTURE, GROUP A STREP ([facility])  SARS CORONAVIRUS 2 (TAT 6-24 HRS)  POCT RAPID STREP A (OFFICE)    EKG   Radiology No results found.  Procedures Procedures (including critical care time)  Medications Ordered in UC Medications - No data to display  Initial Impression / Assessment and Plan / UC Course  I have reviewed the triage vital signs and the nursing notes.  Pertinent labs & imaging results that were available during my care of the patient were reviewed by me and considered in my medical decision making (see chart for details).     Patient presents with symptoms likely from a viral upper respiratory infection. Do not suspect underlying cardiopulmonary process. Symptoms seem unlikely related to ACS, CHF or COPD exacerbations, pneumonia, pneumothorax. Patient is nontoxic appearing and not in need of emergent medical intervention.  Rapid strep is negative.  Throat culture and COVID test pending.  Recommended symptom control with medications and  supportive care. patient was sent prescriptions.  Return if symptoms fail to improve in 1-2 weeks or you develop shortness of breath, chest pain, severe headache. Patient states understanding and is agreeable.  Discharged with PCP followup.  Final Clinical Impressions(s) / UC Diagnoses   Final diagnoses:  Viral upper respiratory tract infection with cough  Sore throat     Discharge Instructions      Strep is negative.  Throat culture and COVID test pending.  Will call if it is abnormal.  I have sent you 3 medications to alleviate symptoms.  Follow-up if any symptoms persist or worsen.     ED Prescriptions     Medication Sig Dispense Auth. Provider   fluticasone (FLONASE) 50 MCG/ACT nasal spray Place 1 spray into both nostrils daily. 16 g Tymeer Vaquera, Rolly Salter E, [place]   benzonatate (TESSALON) 100 MG capsule Take 1 capsule (100 mg total) by mouth every 8 (eight) hours as needed for cough. 21 capsule [facility] [place] E, [place]   phenol (CHLORASEPTIC) 1.4 % LIQD Use as directed 1 spray in the mouth or throat as needed for throat irritation / pain. 118 mL Gustavus Bryant, [place]      PDMP not reviewed this encounter.   Gustavus Bryant, [place] 11/30/22 1944

## 2022-12-01 LAB — CULTURE, GROUP A STREP (THRC)

## 2022-12-01 LAB — SARS CORONAVIRUS 2 (TAT 6-24 HRS): SARS Coronavirus 2: NEGATIVE

## 2022-12-02 ENCOUNTER — Encounter: Payer: Self-pay | Admitting: Emergency Medicine

## 2022-12-02 ENCOUNTER — Ambulatory Visit
Admission: EM | Admit: 2022-12-02 | Discharge: 2022-12-02 | Disposition: A | Discharge status: Home / Self Care | Payer: Medicaid Other | Attending: Family Medicine | Admitting: Family Medicine

## 2022-12-02 DIAGNOSIS — H66003 Acute suppurative otitis media without spontaneous rupture of ear drum, bilateral: Secondary | ICD-10-CM

## 2022-12-02 DIAGNOSIS — J0140 Acute pansinusitis, unspecified: Secondary | ICD-10-CM

## 2022-12-02 MED ORDER — AMOXICILLIN 875 MG PO TABS: 875.0000 mg | ORAL_TABLET | Freq: Two times a day (BID) | ORAL | 0 refills | Status: DC

## 2022-12-02 MED ORDER — PREDNISONE 10 MG PO TABS: 10.0000 mg | ORAL_TABLET | Freq: Every day | ORAL | 0 refills | Status: AC

## 2022-12-02 NOTE — ED Triage Notes (Signed)
Pt said today he began have severe pressure on his right ear. Pt said aches and is giving him a headache.

## 2022-12-03 LAB — CULTURE, GROUP A STREP (THRC)

## 2022-12-28 NOTE — ED Provider Notes (Signed)
[name]    CSN: [id number] Arrival date & time: 12/02/22  1618      History   Chief Complaint Chief Complaint  Patient presents with   Otalgia    HPI Erik [name] is a 22 y.o. male.   HPI Patient presents today for re-evaluation of worsening URI symptoms. Patient see two days ago with viral upper respiratory symptoms of headache, sore throat, fever, nasal congestion, cough  which at the time had been present for a total 4 days. Today he reports continued headache and new symptoms of severe pain in the right ear pain,  and bilateral ear pressure. COVID test and rapid strep both negative two days ago. He continues to endorse fever which improves with Tylenol. He has continued  to take OTC cough and cold medications without significant improvement of symptoms.   Past Medical History:  Diagnosis Date   ADHD (attention deficit hyperactivity disorder)     Patient Active Problem List   Diagnosis Date Noted   Acute appendicitis 05/22/2018    Past Surgical History:  Procedure Laterality Date   LAPAROSCOPIC APPENDECTOMY N/A 05/22/2018   Procedure: APPENDECTOMY LAPAROSCOPIC;  Surgeon: Leonia Corona, MD;  Location: [facility] OR;  Service: Pediatrics;  Laterality: N/A;       Home Medications    Prior to Admission medications   Medication Sig Start Date End Date Taking? Authorizing Provider  amoxicillin (AMOXIL) 875 MG tablet Take 1 tablet (875 mg total) by mouth 2 (two) times daily. 12/02/22  Yes Bing Neighbors, NP  ADDERALL XR 10 MG 24 hr capsule Take by mouth. Patient not taking: Reported on 05/27/2022 05/01/20   [provider]  benzonatate (TESSALON) 100 MG capsule Take 1 capsule (100 mg total) by mouth every 8 (eight) hours as needed for cough. 11/30/22   Gustavus Bryant, FNP  famotidine (PEPCID) 20 MG tablet Take 1 tablet (20 mg total) by mouth 2 (two) times daily. Patient not taking: Reported on 07/20/2022 05/27/22   Ellsworth Lennox, PA-C   fluticasone [facility]) 50 MCG/ACT nasal spray Place 1 spray into both nostrils daily. 11/30/22   Gustavus Bryant, FNP  HYDROcodone-acetaminophen (NORCO/VICODIN) 5-325 MG tablet Take 1-1.5 tablets by mouth every 6 (six) hours as needed for moderate pain. Take only if ibuprofen does not help the pain. Patient not taking: Reported on 06/02/2020 05/23/18   Leonia Corona, MD  ondansetron (ZOFRAN) 4 MG tablet Take 1 tablet (4 mg total) by mouth every 8 (eight) hours as needed for nausea or vomiting. Patient not taking: Reported on 07/20/2022 05/27/22   Ellsworth Lennox, PA-C  phenol [facility]) 1.4 % LIQD Use as directed 1 spray in the mouth or throat as needed for throat irritation / pain. 11/30/22   Gustavus Bryant, FNP    Family History History reviewed. No pertinent family history.  Social History Social History   Tobacco Use   Smoking status: Never   Smokeless tobacco: Never     Allergies   Patient has no known allergies.   Review of Systems Review of Systems Pertinent negatives listed in HPI  Physical Exam Triage Vital Signs ED Triage Vitals  Enc Vitals Group     BP 12/02/22 1713 132/80     Pulse Rate 12/02/22 1713 74     Resp 12/02/22 1713 16     Temp 12/02/22 1713 97.8 F (36.6 C)     Temp Source 12/02/22 1713 Axillary     SpO2 12/02/22 1713 97 %  Weight --      Height --      Head Circumference --      Peak Flow --      Pain Score 12/02/22 1712 10     Pain Loc --      Pain Edu? --      Excl. in GC? --    No data found.  Updated Vital Signs BP 132/80 (BP Location: Left Arm)   Pulse 74   Temp 97.8 F (36.6 C) (Axillary)   Resp 16   SpO2 97%   Visual Acuity Right Eye Distance:   Left Eye Distance:   Bilateral Distance:    Right Eye Near:   Left Eye Near:    Bilateral Near:     Physical Exam Vitals reviewed.  HENT:     Head: Normocephalic and atraumatic.     Right Ear: Decreased hearing noted. Swelling and tenderness present. Tympanic membrane is  injected, erythematous and bulging.     Left Ear: A middle ear effusion is present. Tympanic membrane is erythematous.     Nose: Nasal tenderness, mucosal edema, congestion and rhinorrhea present. Rhinorrhea is purulent.     Right Turbinates: Enlarged.     Left Turbinates: Enlarged.     Right Sinus: Maxillary sinus tenderness and frontal sinus tenderness present.     Left Sinus: Maxillary sinus tenderness and frontal sinus tenderness present.     Mouth/Throat:     Lips: Pink.     Mouth: Mucous membranes are moist.     Tongue: No lesions.     Pharynx: Oropharynx is clear. Uvula midline.  Eyes:     Extraocular Movements: Extraocular movements intact.     Pupils: Pupils are equal, round, and reactive to light.  Cardiovascular:     Rate and Rhythm: Normal rate.  Pulmonary:     Effort: Pulmonary effort is normal.     Breath sounds: Normal breath sounds.  Skin:    General: Skin is warm and dry.     Capillary Refill: Capillary refill takes less than 2 seconds.  Neurological:     Mental Status: He is alert.      UC Treatments / Results  Labs (all labs ordered are listed, but only abnormal results are displayed) Labs Reviewed - No data to display  EKG   Radiology No results found.  Procedures Procedures (including critical care time)  Medications Ordered in UC Medications - No data to display  Initial Impression / Assessment and Plan / UC Course  I have reviewed the triage vital signs and the nursing notes.  Pertinent labs & imaging results that were available during my care of the patient were reviewed by me and considered in my medical decision making (see chart for details).   Acute bilateral otitis media with acute sinuitis  Treatment per discharge medications orders. Continue Tylenol for fever.  Return precautions given. Final Clinical Impressions(s) / UC Diagnoses   Final diagnoses:  Non-recurrent acute suppurative otitis media of both ears without spontaneous  rupture of tympanic membranes  Acute non-recurrent pansinusitis   Discharge Instructions   None    ED Prescriptions     Medication Sig Dispense Auth. Provider   amoxicillin (AMOXIL) 875 MG tablet Take 1 tablet (875 mg total) by mouth 2 (two) times daily. 20 tablet Bing Neighbors, NP   predniSONE (DELTASONE) 10 MG tablet Take 1 tablet (10 mg total) by mouth daily with breakfast for 5 days. 5 tablet Bing Neighbors, NP  PDMP not reviewed this encounter.   Bing Neighbors, NP 12/28/22 0730

## 2023-01-25 ENCOUNTER — Encounter (INDEPENDENT_AMBULATORY_CARE_PROVIDER_SITE_OTHER): Payer: Self-pay | Admitting: Primary Care

## 2023-01-25 ENCOUNTER — Ambulatory Visit (INDEPENDENT_AMBULATORY_CARE_PROVIDER_SITE_OTHER): Payer: Medicaid Other | Admitting: Primary Care

## 2023-01-25 VITALS — BP 122/73 | HR 69 | Resp 16 | Ht 67.0 in | Wt 176.8 lb

## 2023-01-25 DIAGNOSIS — Z6827 Body mass index (BMI) 27.0-27.9, adult: Secondary | ICD-10-CM | POA: Diagnosis not present

## 2023-01-25 DIAGNOSIS — Z0000 Encounter for general adult medical examination without abnormal findings: Secondary | ICD-10-CM | POA: Diagnosis not present

## 2023-01-25 DIAGNOSIS — Z23 Encounter for immunization: Secondary | ICD-10-CM | POA: Diagnosis not present

## 2023-01-25 DIAGNOSIS — E663 Overweight: Secondary | ICD-10-CM | POA: Diagnosis not present

## 2023-01-25 DIAGNOSIS — H6121 Impacted cerumen, right ear: Secondary | ICD-10-CM | POA: Diagnosis not present

## 2023-01-25 NOTE — Progress Notes (Signed)
[facility]  Patient presents to clinic today for annual exam.   Acute Concerns: no  Chronic Issues: Past Medical History:  Diagnosis Date   ADHD (attention deficit hyperactivity disorder)       Health Maintenance: Immunizations -- UTD  HIV/Hep C Screening --   Past Medical History:  Diagnosis Date   ADHD (attention deficit hyperactivity disorder)     Past Surgical History:  Procedure Laterality Date   LAPAROSCOPIC APPENDECTOMY N/A 05/22/2018   Procedure: APPENDECTOMY LAPAROSCOPIC;  Surgeon: Leonia Corona, MD;  Location: [facility] OR;  Service: Pediatrics;  Laterality: N/A;    Current Outpatient Medications on File Prior to Visit  Medication Sig Dispense Refill   ADDERALL XR 10 MG 24 hr capsule Take by mouth. (Patient not taking: Reported on 05/27/2022)     amoxicillin (AMOXIL) 875 MG tablet Take 1 tablet (875 mg total) by mouth 2 (two) times daily. (Patient not taking: Reported on 01/25/2023) 20 tablet 0   benzonatate (TESSALON) 100 MG capsule Take 1 capsule (100 mg total) by mouth every 8 (eight) hours as needed for cough. (Patient not taking: Reported on 01/25/2023) 21 capsule 0   famotidine (PEPCID) 20 MG tablet Take 1 tablet (20 mg total) by mouth 2 (two) times daily. (Patient not taking: Reported on 07/20/2022) 30 tablet 0   fluticasone (FLONASE) 50 MCG/ACT nasal spray Place 1 spray into both nostrils daily. (Patient not taking: Reported on 01/25/2023) 16 g 0   HYDROcodone-acetaminophen (NORCO/VICODIN) 5-325 MG tablet Take 1-1.5 tablets by mouth every 6 (six) hours as needed for moderate pain. Take only if ibuprofen does not help the pain. (Patient not taking: Reported on 06/02/2020) 15 tablet 0   ondansetron (ZOFRAN) 4 MG tablet Take 1 tablet (4 mg total) by mouth every 8 (eight) hours as needed for nausea or vomiting. (Patient not taking: Reported on 07/20/2022) 20 tablet 0   phenol (CHLORASEPTIC) 1.4 % LIQD Use as directed 1 spray in the mouth or throat as  needed for throat irritation / pain. (Patient not taking: Reported on 01/25/2023) 118 mL 0   No current facility-administered medications on file prior to visit.    No Known Allergies  No family history on file.  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 Lives with grandparents and sister. Works at [employer].   Social Determinants of Health   Financial Resource Strain: Low Risk  (05/22/2018)   Overall Financial Resource Strain (CARDIA)    Difficulty of Paying Living Expenses: Not hard at all  Food Insecurity: No Food Insecurity (05/22/2018)   Hunger Vital Sign    Worried About Running Out of Food in the Last Year: Never true    Ran Out of Food in the Last Year: Never true  Transportation Needs: Unknown (05/22/2018)   PRAPARE - Administrator, Civil Service (Medical): Patient declined  Lack of Transportation (Non-Medical): Patient declined  Physical Activity: Not on file  Stress: No Stress Concern Present (05/22/2018)   [employer] of Occupational Health - Occupational Stress Questionnaire    Feeling of Stress : Not at all  Social Connections: Unknown (05/22/2018)   Social Connection and Isolation Panel [NHANES]    Frequency of Communication with Friends and Family: Not on file    Frequency of Social Gatherings with Friends and Family: Not on file    Attends Religious Services: Not on file    Active Member of Clubs or Organizations: Yes    Attends Tax inspector Meetings: Not on file    Marital Status: Not on file  Intimate Partner Violence: Unknown (05/22/2018)   Humiliation, Afraid, Rape, and Kick questionnaire    Fear of Current or Ex-Partner: Patient declined    Emotionally Abused: Patient declined    Physically Abused: Patient declined    Sexually Abused: Patient declined    ROS Comprehensive ROS Pertinent positive and negative noted in HPI    Blood Pressure 122/73   Pulse 69   Respiration 16   Height 5\' 7"  (1.702 m)   Weight 176 lb 12.8 oz (80.2 kg)   Oxygen Saturation 97%   Body Mass Index 27.69 kg/m   Physical Exam Vitals reviewed.  Constitutional:      Appearance: Normal appearance.  HENT:     Head: Normocephalic.     Right Ear: External ear normal. There is impacted cerumen.     Left Ear: Tympanic membrane and external ear normal.     Nose: Nose normal.  Eyes:     Extraocular Movements: Extraocular movements intact.     Pupils: Pupils are equal, round, and reactive to light.  Cardiovascular:     Rate and Rhythm: Normal rate and regular rhythm.  Pulmonary:     Effort: Pulmonary effort is normal.     Breath sounds: Normal breath sounds.  Abdominal:     General: Bowel sounds are normal.  Musculoskeletal:        General: Normal range of motion.     Cervical back: Normal range of motion.  Skin:    General: Skin is warm and dry.  Neurological:     Mental Status: He is alert and oriented to person, place, and time.  Psychiatric:        Mood and Affect: Mood normal.   Recent Results (from the past 2160 hour(s))  POCT rapid strep A     Status: None   Collection Time: 11/30/22  7:19 PM  Result Value Ref Range   Rapid Strep A Screen Negative Negative  Culture, group A strep     Status: None   Collection Time: 11/30/22  7:23 PM   Specimen: Throat  Result Value Ref Range   Specimen Description THROAT    Special Requests NONE    Culture      NO GROUP A STREP (S.PYOGENES) ISOLATED Performed at [facility], [address], [place], [place] [address]    Report Status 12/03/2022 FINAL   SARS CORONAVIRUS 2 (TAT 6-24 HRS) Anterior Nasal Swab     Status: None   Collection Time: 11/30/22  7:32 PM   Specimen: Anterior Nasal Swab  Result Value Ref Range   SARS Coronavirus 2 NEGATIVE NEGATIVE    Comment: (NOTE) SARS-CoV-2 target nucleic acids are NOT DETECTED.  The SARS-CoV-2 RNA is generally detectable in upper and lower respiratory specimens during the acute phase of  infection. Negative results do not preclude SARS-CoV-2 infection, do not rule out co-infections with other pathogens, and should not be used as the sole basis for treatment or other patient management decisions. Negative results must be combined with clinical observations, patient history, and epidemiological information. The expected result is Negative.  Fact Sheet for Patients: HairSlick.no  Fact Sheet for Healthcare Providers: quierodirigir.com  This test is not yet approved or cleared by the [place] FDA and  has been authorized for detection and/or diagnosis of SARS-CoV-2 by FDA under an Emergency Use Authorization (EUA). This EUA will remain  in effect (meaning this test can be used) for the duration of the COVID-19 declaration under Se ction 564(b)(1) of the Act, 21 U.S.C. section 360bbb-3(b)(1), unless the authorization is terminated or revoked sooner.  Performed at [facility], [address] [place], [place] [address]     Assessment/Plan: Brett was seen today for annual exam.  Diagnoses and all orders for this visit:  Annual physical exam  Overweight (BMI 25.0-29.9) Obesity is 30-39 indicating an excess in caloric intake or underlining conditions. This may lead to other co-morbidities. Educated on lifestyle modifications of diet and exercise which may reduce obesity.    Hearing loss of right ear due to cerumen  impaction Return for ear irrigation   This note has been created with Education officer, environmental. Any transcriptional errors are unintentional.   Grayce Sessions, NP 01/25/2023, 3:14 PM

## 2023-01-25 NOTE — Patient Instructions (Signed)
HPV Vaccine Information for Parents  Human papillomavirus (HPV) is a common virus. It spreads easily from person to person through skin-to-skin or sexual contact. There are many types of HPV viruses. Genital or mucosal HPV can cause warts in the genitals. Cutaneous or nonmucosal HPV can cause warts on the hands or feet. Some genital HPV types may cause cancer. Your child can get a shot to help prevent the HPV types that can cause cancer, genital warts, or warts near the opening of the butt (anus). The vaccine is safe and effective. It is recommended that your child get the vaccine at about 93-25 years of age. Getting the vaccine before your child is sexually active gives them the best protection from HPV through adulthood. How can HPV affect my child? An infection with HPV can cause: Genital warts. Mouth or throat cancer. Cancer of the anus. Cancer of the lowest part of the uterus (cervix), outer male genital area (vulva), or vagina. Cancer of the penis (penile cancer). During pregnancy, HPV can be passed to the baby. This infection can cause warts to form in the baby's throat and mouth. What actions can I take to lower my child's risk for HPV? Have your child get the HPV vaccine before they become sexually active. The best time to get the shot is at around 66-46 years of age. The vaccine may be given to children as young as 33 years old.  If your child gets the vaccine before they are 24 years old, it can be given as 2 shots, 6-12 months apart. In some cases, 3 doses are needed. Your child may need 3 doses if: They get the first dose before they are 20 years old but do not have a second dose within 6-12 months after the first dose. They get their first dose after they are 20 years old. They will need to get the other 2 doses within 6 months of the first dose. They have a weak body defense system (immune system). What are the risks and benefits of the HPV vaccine? Benefits Getting the vaccine  can help prevent certain cancers. These include: Oral cancer. This is cancer of the mouth. Anal cancer. This is cancer of the anus. Cancers of the cervix, vulva, and vagina in females. Penile cancer in males. Your child is less likely to get these cancers if they get the vaccine before they become sexually active. The vaccine also prevents genital warts caused by HPV. Risks In rare cases, side effects and reactions have been reported. These include: Soreness, redness, or swelling at the injection site. Dizziness or fainting. Fever. Headache. Muscle or joint pain. Who should not get the HPV vaccine or wait to get it? Some children should not get the HPV vaccine or should wait to get it. Ask the health care provider if your child should get the vaccine if: Your child has had a severe allergic reaction to other vaccines. Your child is allergic to yeast. Your child has a fever. Your child has had a recent illness. Your child is pregnant or may be pregnant. Where to find more information Centers for Disease Control and Prevention (CDC): TonerPromos.no American Academy of Pediatrics (AAP): healthychildren.org This information is not intended to replace advice given to you by your health care provider. Make sure you discuss any questions you have with your health care provider. Document Revised: 03/25/2022 Document Reviewed: 03/25/2022 Elsevier Patient Education  2024 [employer].

## 2023-02-27 ENCOUNTER — Ambulatory Visit (INDEPENDENT_AMBULATORY_CARE_PROVIDER_SITE_OTHER): Payer: Medicaid Other

## 2023-04-13 ENCOUNTER — Ambulatory Visit
Admission: EM | Admit: 2023-04-13 | Discharge: 2023-04-13 | Disposition: A | Discharge status: Home / Self Care | Payer: Medicaid Other | Attending: Physician Assistant | Admitting: Physician Assistant

## 2023-04-13 DIAGNOSIS — H00014 Hordeolum externum left upper eyelid: Secondary | ICD-10-CM

## 2023-04-13 DIAGNOSIS — E669 Obesity, unspecified: Secondary | ICD-10-CM | POA: Insufficient documentation

## 2023-04-13 MED ORDER — ERYTHROMYCIN 5 MG/GM OP OINT: TOPICAL_OINTMENT | OPHTHALMIC | 0 refills | Status: AC

## 2023-04-13 NOTE — ED Triage Notes (Signed)
"  I woke up this morning and my left eye, upper lid was swollen". "A little crusty this morning, ? Discharge". No injury. No eye redness or pain "just eye lid".

## 2023-04-13 NOTE — ED Provider Notes (Signed)
Erik [name]    CSN: [id number] Arrival date & time: 04/13/23  0854      History   Chief Complaint Chief Complaint  Patient presents with   Eye Problem    HPI Erik [name] is a 22 y.o. male.   Patient here today for patient of mild swelling to his left upper eyelid.  He states he woke with this this morning.  He noted some mild crusting to his eye this morning but no other discharge.  He has not any injury.  He reports mild pain to the eyelid itself.  He does not report vision changes.  He does not report treatment for symptoms.  The history is provided by the patient.    Past Medical History:  Diagnosis Date   ADHD (attention deficit hyperactivity disorder)     Patient Active Problem List   Diagnosis Date Noted   Obesity 04/13/2023   Vaccine counseling 04/30/2020   Need for vaccination 04/30/2020   Acute appendicitis 05/22/2018    Past Surgical History:  Procedure Laterality Date   LAPAROSCOPIC APPENDECTOMY N/A 05/22/2018   Procedure: APPENDECTOMY LAPAROSCOPIC;  Surgeon: Leonia Corona, MD;  Location: [facility] OR;  Service: Pediatrics;  Laterality: N/A;       Home Medications    Prior to Admission medications   Medication Sig Start Date End Date Taking? Authorizing Provider  erythromycin ophthalmic ointment Place a 1/2 inch ribbon of ointment into the lower eyelid. 04/13/23  Yes [name], PA-C    Family History History reviewed. No pertinent family history.  Social History Social History   Tobacco Use   Smoking status: Never    Passive exposure: Never   Smokeless tobacco: Never  Vaping Use   Vaping status: Never Used  Substance Use Topics   Alcohol use: Never   Drug use: Never     Allergies   Patient has no known allergies.   Review of Systems Review of Systems  Constitutional:  Negative for chills and fever.  Eyes:  Positive for pain. Negative for photophobia, discharge, redness and visual disturbance.  Neurological:   Negative for numbness.     Physical Exam Triage Vital Signs ED Triage Vitals  Encounter Vitals Group     BP      Systolic BP Percentile      Diastolic BP Percentile      Pulse      Resp      Temp      Temp src      SpO2      Weight      Height      Head Circumference      Peak Flow      Pain Score      Pain Loc      Pain Education      Exclude from Growth Chart    No data found.  Updated Vital Signs BP 113/68 (BP Location: Right Arm)   Pulse 72   Temp 97.6 F (36.4 C) (Oral)   Resp 16   Ht 5\' 7"  (1.702 m)   Wt 175 lb (79.4 kg)   SpO2 96%   BMI 27.41 kg/m   Visual Acuity Right Eye Distance: 20/20 (Uncorrected) Left Eye Distance: 20/25 (Uncorrected) Bilateral Distance:       Physical Exam Vitals and nursing note reviewed.  Constitutional:      General: He is not in acute distress.    Appearance: Normal appearance. He is not ill-appearing.  HENT:  Head: Normocephalic and atraumatic.  Eyes:     Extraocular Movements: Extraocular movements intact.     Conjunctiva/sclera: Conjunctivae normal.     Pupils: Pupils are equal, round, and reactive to light.     Comments: Mild swelling to the lateral upper eyelid  Cardiovascular:     Rate and Rhythm: Normal rate.  Pulmonary:     Effort: Pulmonary effort is normal.  Neurological:     Mental Status: He is alert.  Psychiatric:        Mood and Affect: Mood normal.        Behavior: Behavior normal.        Thought Content: Thought content normal.      UC Treatments / Results  Labs (all labs ordered are listed, but only abnormal results are displayed) Labs Reviewed - No data to display  EKG   Radiology No results found.  Procedures Procedures (including critical [name] time)  Medications Ordered in UC Medications - No data to display  Initial Impression / Assessment and Plan / UC Course  I have reviewed the triage vital signs and the nursing notes.  Pertinent labs & imaging results that were  available during my [name] of the patient were reviewed by me and considered in my medical decision making (see chart for details).    Suspect stye and recommended alternating warm and cold compresses.  Discussed that it may take several days for symptoms to resolve completely.  Erythromycin ointment prescribed.  Encouraged follow-up if no gradual improvement with any further concerns.  Final Clinical Impressions(s) / UC Diagnoses   Final diagnoses:  Hordeolum externum of left upper eyelid   Discharge Instructions   None    ED Prescriptions     Medication Sig Dispense Auth. Provider   erythromycin ophthalmic ointment Place a 1/2 inch ribbon of ointment into the lower eyelid. 3.5 g [name], PA-C      PDMP not reviewed this encounter.   [name], PA-C 04/13/23 [phone]

## 2024-01-25 ENCOUNTER — Telehealth (INDEPENDENT_AMBULATORY_CARE_PROVIDER_SITE_OTHER): Payer: Self-pay | Admitting: Primary Care

## 2024-01-25 NOTE — Telephone Encounter (Signed)
 Called pt to confirm appt 7/18

## 2024-01-26 ENCOUNTER — Ambulatory Visit (INDEPENDENT_AMBULATORY_CARE_PROVIDER_SITE_OTHER): Admitting: Primary Care

## 2024-01-26 ENCOUNTER — Encounter (INDEPENDENT_AMBULATORY_CARE_PROVIDER_SITE_OTHER): Payer: Self-pay | Admitting: Primary Care

## 2024-01-26 VITALS — BP 118/72 | HR 64 | Resp 16 | Ht 67.0 in | Wt 166.8 lb

## 2024-01-26 DIAGNOSIS — Z0000 Encounter for general adult medical examination without abnormal findings: Secondary | ICD-10-CM | POA: Diagnosis not present

## 2024-01-26 DIAGNOSIS — R1084 Generalized abdominal pain: Secondary | ICD-10-CM | POA: Diagnosis not present

## 2024-01-26 NOTE — Progress Notes (Signed)
 Medication Samples have been provided to the patient.  Drug name: Xifaxan      Strength: 550mg         Qty: 1 box with 9 pills  LOT: [id number]  Exp.Date: 02/07/2025  Dosing instructions: take 3 tablets daily   The patient has been instructed regarding the correct time, dose, and frequency of taking this medication, including desired effects and most common side effects.   Erik [name] 10:26 AM 01/26/2024

## 2024-01-26 NOTE — Progress Notes (Signed)
 [facility]  Erik [name] is a 23 y.o. male presents to office today for annual physical exam examination.    Concerns today include: 1. Abdominal pain- left overs, fast foods , wakes up nausea when go to bed , bloated  2-3 times a week   Occupation: Stage manager, Marital status: S, Substance use: No Diet: None, Exercise: yes  Health Maintenance  Topic Date Due   Meningococcal B Vaccine (1 of 2 - Standard) Never done   COVID-19 Vaccine (4 - 2024-25 season) 03/12/2023   DTaP/Tdap/Td (8 - Td or Tdap) 03/26/2023   INFLUENZA VACCINE  02/09/2024   Hepatitis B Vaccines  Completed   HPV VACCINES  Completed   Hepatitis C Screening  Completed   HIV Screening  Completed     Past Medical History:  Diagnosis Date   ADHD (attention deficit hyperactivity disorder)    Social History   Socioeconomic History   Marital status: Single    Spouse name: Not on file   Number of children: Not on file   Years of education: Not on file   Highest education level: Not on file  Occupational History   Occupation: Designer, jewellery  Tobacco Use   Smoking status: Never    Passive exposure: Never   Smokeless tobacco: Never  Vaping Use   Vaping status: Never Used  Substance and Sexual Activity   Alcohol use: Never   Drug use: Never   Sexual activity: Not Currently  Other Topics Concern   Not on file  Social History Narrative   Senior at [employer] 21-22 school year. Lives with grandparents and sister. Works at [employer].   Social Drivers of Corporate investment banker Strain: Low Risk  (05/22/2018)   Overall Financial Resource Strain (CARDIA)    Difficulty of Paying Living Expenses: Not hard at all  Food Insecurity: No Food Insecurity (05/22/2018)   Hunger Vital Sign    Worried About Running Out of Food in the Last Year: Never true    Ran Out of Food in the Last Year: Never true  Transportation Needs: Unknown (05/22/2018)   PRAPARE - Scientist, research (physical sciences) (Medical): Patient declined    Lack of Transportation (Non-Medical): Patient declined  Physical Activity: Not on file  Stress: No Stress Concern Present (05/22/2018)   [employer] of Occupational Health - Occupational Stress Questionnaire    Feeling of Stress : Not at all  Social Connections: Unknown (05/22/2018)   Social Connection and Isolation Panel    Frequency of Communication with Friends and Family: Not on file    Frequency of Social Gatherings with Friends and Family: Not on file    Attends Religious Services: Not on file    Active Member of Clubs or Organizations: Yes    Attends Banker Meetings: Not on file    Marital Status: Not on file  Intimate Partner Violence: Unknown (05/22/2018)   Humiliation, Afraid, Rape, and Kick questionnaire    Fear of Current or Ex-Partner: Patient declined    Emotionally Abused: Patient declined    Physically Abused: Patient declined    Sexually Abused: Patient declined   Past Surgical History:  Procedure Laterality Date   LAPAROSCOPIC APPENDECTOMY N/A 05/22/2018   Procedure: APPENDECTOMY LAPAROSCOPIC;  Surgeon: Claudius Kaplan, MD;  Location: [facility] OR;  Service: Pediatrics;  Laterality: N/A;   No family history on file.  Current Outpatient Medications:    erythromycin  ophthalmic ointment, Place a 1/2 inch ribbon of ointment  into the lower eyelid. (Patient not taking: Reported on 01/26/2024), Disp: 3.5 g, Rfl: 0 Outpatient Encounter Medications as of 01/26/2024  Medication Sig   erythromycin  ophthalmic ointment Place a 1/2 inch ribbon of ointment into the lower eyelid. (Patient not taking: Reported on 01/26/2024)   No facility-administered encounter medications on file as of 01/26/2024.    No Known Allergies   ROS: Review of Systems Pertinent items noted in HPI and remainder of comprehensive ROS otherwise negative.    Physical exam Physical exam: General: Vital signs reviewed.  Patient is well-developed  and well-nourished,Body mass index is 26.12 kg/m.  in no acute distress and cooperative with exam. Head: Normocephalic and atraumatic. Eyes: EOMI, conjunctivae normal, no scleral icterus. Neck: Supple, trachea midline, normal ROM, no JVD, masses, thyromegaly, or carotid bruit present. Cardiovascular: RRR, S1 normal, S2 normal, no murmurs, gallops, or rubs. Pulmonary/Chest: Clear to auscultation bilaterally, no wheezes, rales, or rhonchi. Abdominal: Soft, non-tender, non-distended, BS +, no masses, organomegaly, or guarding present. Musculoskeletal: No joint deformities, erythema, or stiffness, ROM full and nontender. Extremities: No lower extremity edema bilaterally,  pulses symmetric and intact bilaterally. No cyanosis or clubbing. Neurological: A&O x3, Strength is normal Skin: Warm, dry and intact. No rashes or erythema. Psychiatric: Normal mood and affect. speech and behavior is normal. Cognition and memory are normal.      Assessment/ Plan: Erik [name] here for annual physical exam.  Erik [name] was seen today for annual exam and abdominal pain.  Diagnoses and all orders for this visit:  Annual physical exam -     CMP14+EGFR  Generalized abdominal pain -     CBC with Differential -     CMP14+EGFR    Counseled on healthy lifestyle choices, including diet (rich in fruits, vegetables and lean meats and low in salt and simple carbohydrates) and exercise (at least 30 minutes of moderate physical activity daily).  Patient to follow up in 1 year for annual exam or sooner if needed.  The above assessment and management plan was discussed with the patient. The patient verbalized understanding of and has agreed to the management plan. Patient is aware to call the clinic if symptoms persist or worsen. Patient is aware when to return to the clinic for a follow-up visit. Patient educated on when it is appropriate to go to the emergency department.   This note has been created with Biomedical engineer. Any transcriptional errors are unintentional.   Erik SHAUNNA Bohr, NP 01/26/2024, 10:08 AM

## 2024-01-27 LAB — CMP14+EGFR
ALT: 13 IU/L (ref 0–44)
AST: 17 IU/L (ref 0–40)
Albumin: 4.7 g/dL (ref 4.3–5.2)
Alkaline Phosphatase: 74 IU/L (ref 44–121)
BUN/Creatinine Ratio: 12 (ref 9–20)
BUN: 13 mg/dL (ref 6–20)
Bilirubin Total: 0.2 mg/dL (ref 0.0–1.2)
CO2: 24 mmol/L (ref 20–29)
Calcium: 9.9 mg/dL (ref 8.7–10.2)
Chloride: 105 mmol/L (ref 96–106)
Creatinine, Ser: 1.11 mg/dL (ref 0.76–1.27)
Globulin, Total: 2.6 g/dL (ref 1.5–4.5)
Glucose: 78 mg/dL (ref 70–99)
Potassium: 4.6 mmol/L (ref 3.5–5.2)
Sodium: 140 mmol/L (ref 134–144)
Total Protein: 7.3 g/dL (ref 6.0–8.5)
eGFR: 97 mL/min/1.73 (ref >59)

## 2024-01-27 LAB — CBC WITH DIFFERENTIAL/PLATELET
Basophils Absolute: 0 x10E3/uL (ref 0.0–0.2)
Basos: 0 %
EOS (ABSOLUTE): 0.2 x10E3/uL (ref 0.0–0.4)
Eos: 2 %
Hematocrit: 49.3 % (ref 37.5–51.0)
Hemoglobin: 16 g/dL (ref 13.0–17.7)
Immature Grans (Abs): 0 x10E3/uL (ref 0.0–0.1)
Immature Granulocytes: 0 %
Lymphocytes Absolute: 2.4 x10E3/uL (ref 0.7–3.1)
Lymphs: 31 %
MCH: 32.1 pg (ref 26.6–33.0)
MCHC: 32.5 g/dL (ref 31.5–35.7)
MCV: 99 fL — ABNORMAL HIGH (ref 79–97)
Monocytes Absolute: 0.5 x10E3/uL (ref 0.1–0.9)
Monocytes: 7 %
Neutrophils Absolute: 4.7 x10E3/uL (ref 1.4–7.0)
Neutrophils: 60 %
Platelets: 206 x10E3/uL (ref 150–450)
RBC: 4.99 x10E6/uL (ref 4.14–5.80)
RDW: 12.8 % (ref 11.6–15.4)
WBC: 7.9 x10E3/uL (ref 3.4–10.8)

## 2024-01-28 MED ORDER — RIFAXIMIN 550 MG PO TABS: 550.0000 mg | ORAL_TABLET | Freq: Three times a day (TID) | ORAL | 1 refills | Status: AC

## 2024-01-29 ENCOUNTER — Other Ambulatory Visit (INDEPENDENT_AMBULATORY_CARE_PROVIDER_SITE_OTHER): Payer: Self-pay | Admitting: Primary Care

## 2024-01-29 ENCOUNTER — Ambulatory Visit: Payer: Self-pay | Admitting: Primary Care

## 2024-01-31 NOTE — Telephone Encounter (Signed)
 Requested medication (s) are due for refill today: yes  Requested medication (s) are on the active medication list: yes  Last refill:  01/28/24  Future visit scheduled: no  Notes to clinic:  Pharmacy comment: Alternative Requested:DO PA.      Requested Prescriptions  Pending Prescriptions Disp Refills   XIFAXAN  550 MG TABS tablet [Pharmacy Med Name: XIFAXAN  550 MG TABLET] 42 tablet 1    Sig: TAKE 1 TABLET BY MOUTH 3 TIMES DAILY.     Off-Protocol Failed - 01/31/2024  1:52 PM      Failed - Medication not assigned to a protocol, review manually.      Passed - Valid encounter within last 12 months    Recent Outpatient Visits           5 days ago Annual physical exam   [place] [facility] Celestia Rosaline SQUIBB, NP   1 year ago Annual physical exam   [place] [facility] Celestia Rosaline SQUIBB, NP   1 year ago Encounter for medical examination to establish care   [place] [facility] Celestia Rosaline SQUIBB, NP

## 2024-03-18 NOTE — Addendum Note (Signed)
 Addended by: CAIRRIKIER DAVIDSON, Elmar Antigua M on: 03/18/2024 08:20 AM   Modules accepted: Level of Service
